# Patient Record
Sex: Female | Born: 1995 | Race: Black or African American | Hispanic: No | Marital: Single | Smoking: Never smoker
Health system: Southern US, Community
[De-identification: ages and names within clinical notes are randomized; demographics above are authoritative.]

---

## 2013-05-15 ENCOUNTER — Ambulatory Visit: Payer: Self-pay | Admitting: Family Medicine

## 2014-10-06 IMAGING — US ABDOMEN ULTRASOUND LIMITED
1 series · 14 of 25 positions shown · non-contrast
Comparison: none

REASON FOR EXAM: epigastric pain regurgitation of food  eval for
gallstones
COMMENTS:

[Series 1: abdomen ultrasound limited · 0.25mm/px · 14 of 79 slices shown]
[im 1/79]
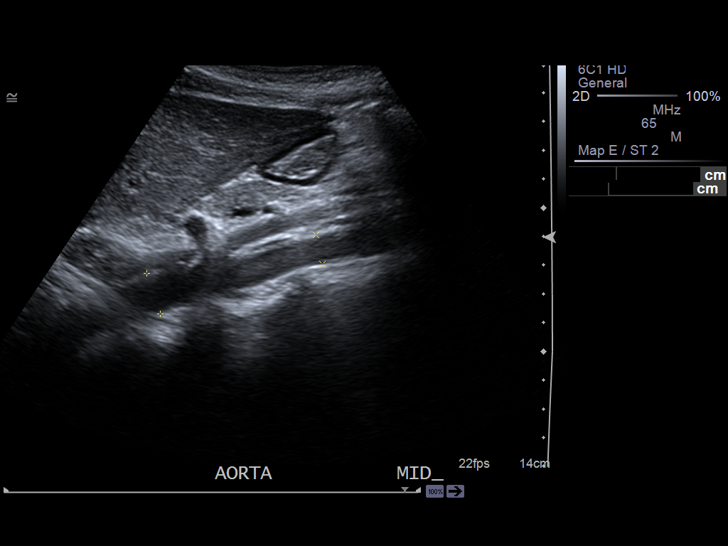
[im 7/79]
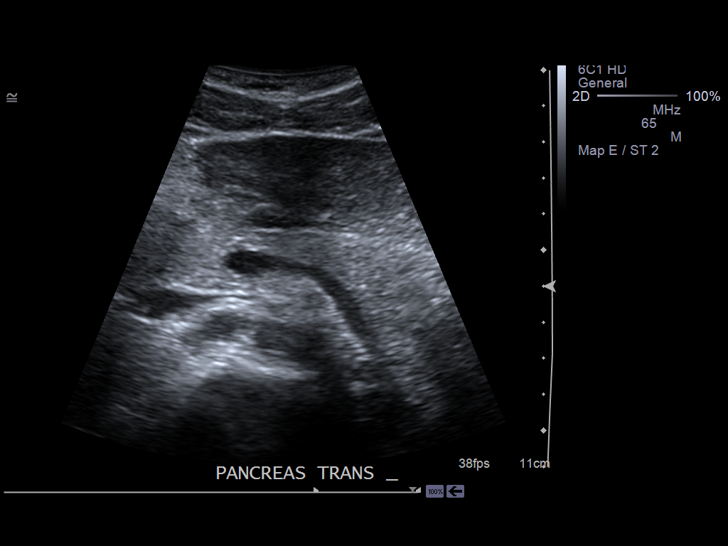
[im 14/79]
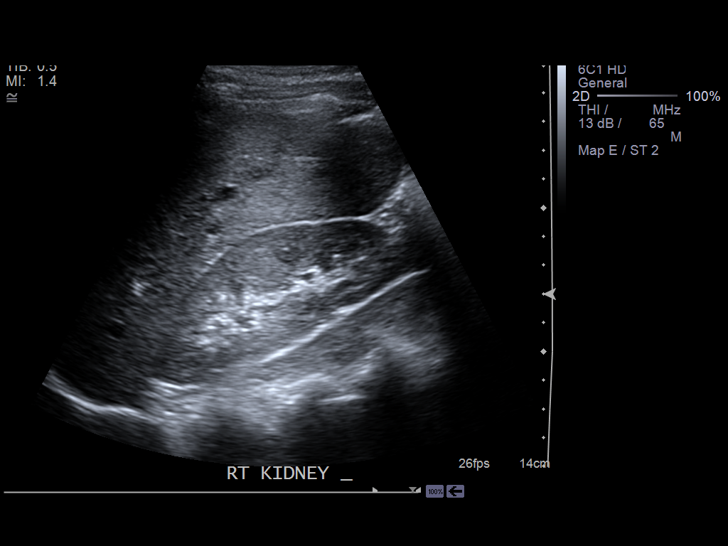
[im 20/79]
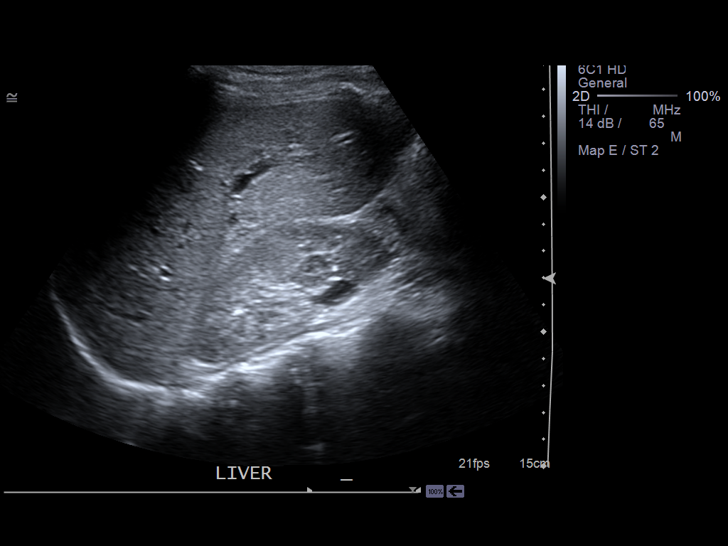
[im 27/79]
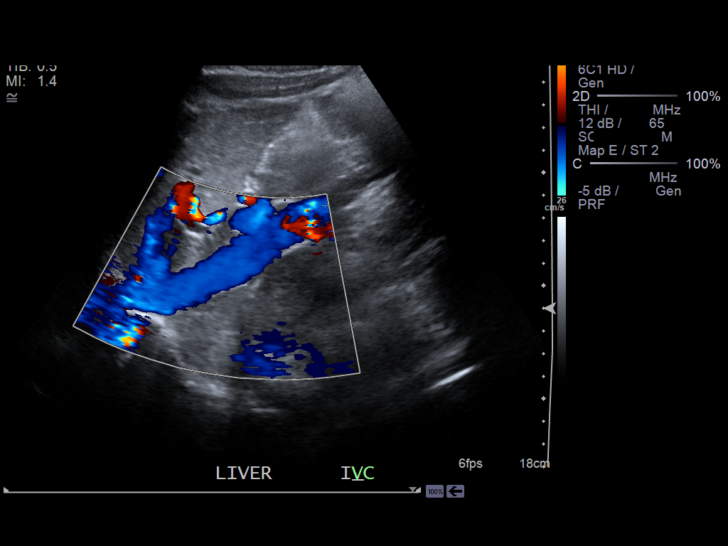
[im 30/79]
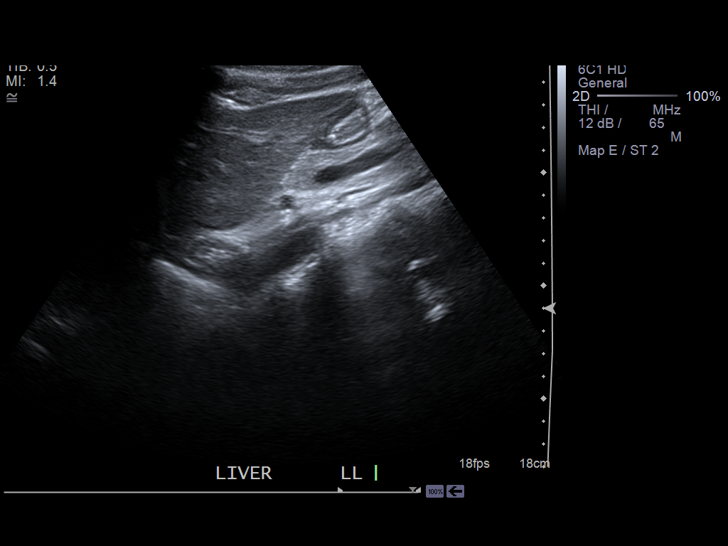
[im 36/79]
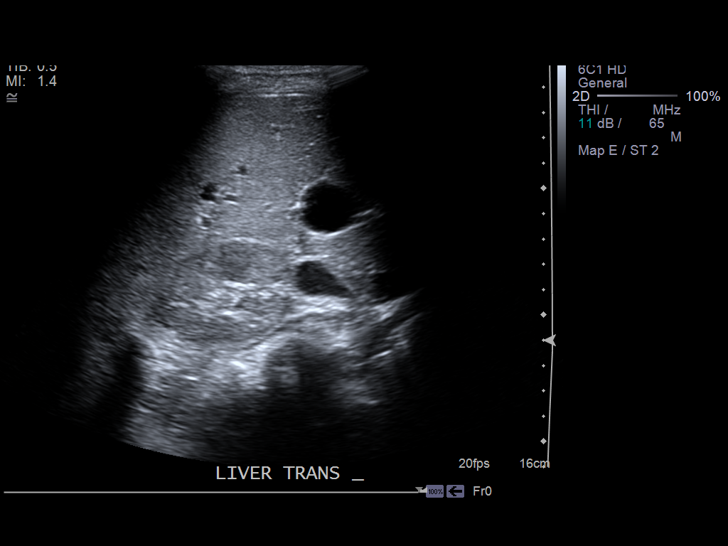
[im 43/79]
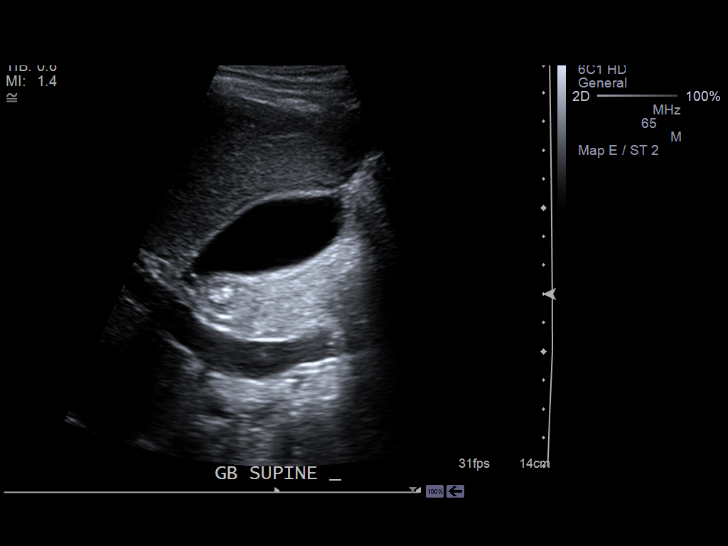
[im 49/79]
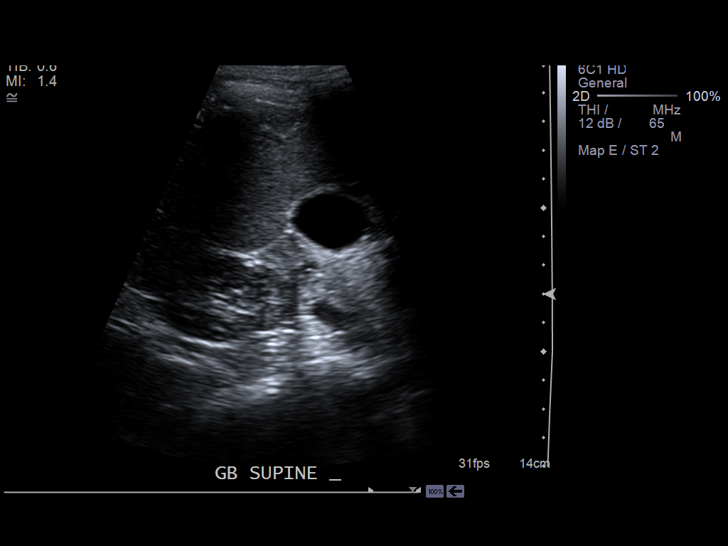
[im 53/79]
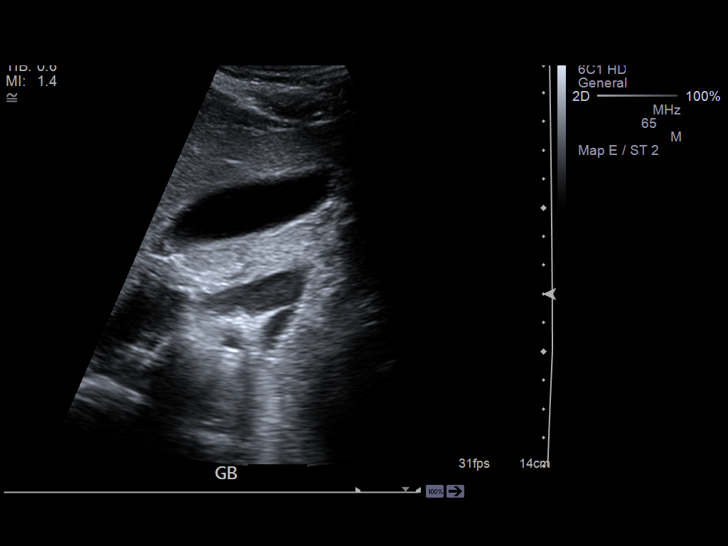
[im 59/79]
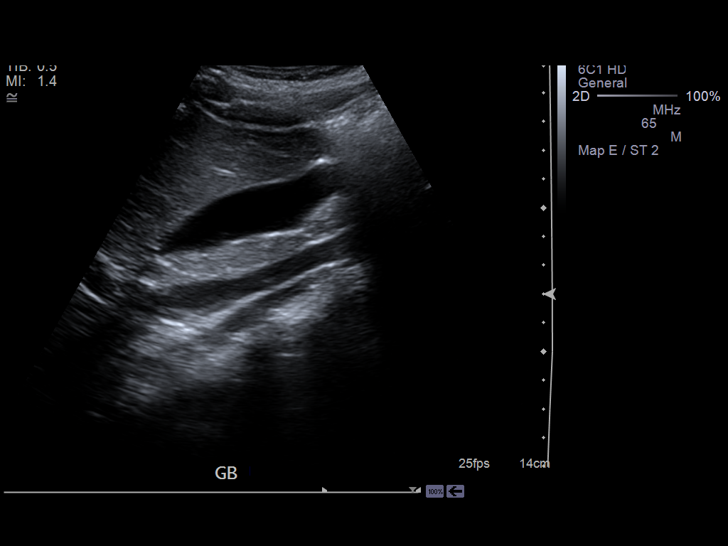
[im 66/79]
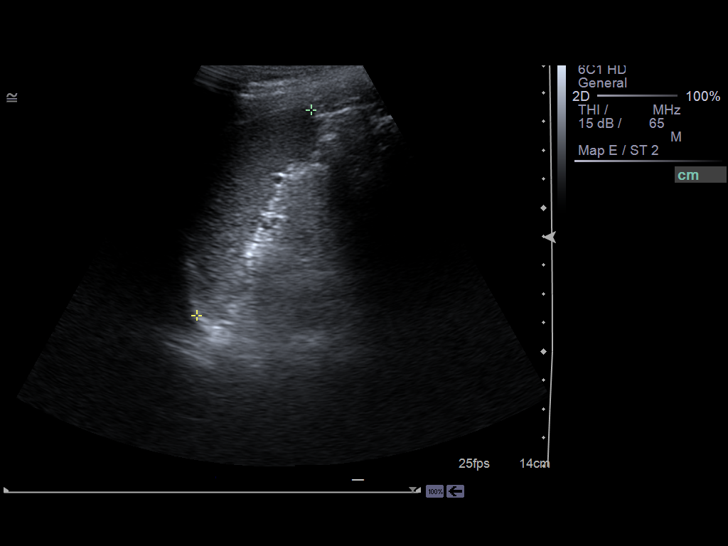
[im 72/79]
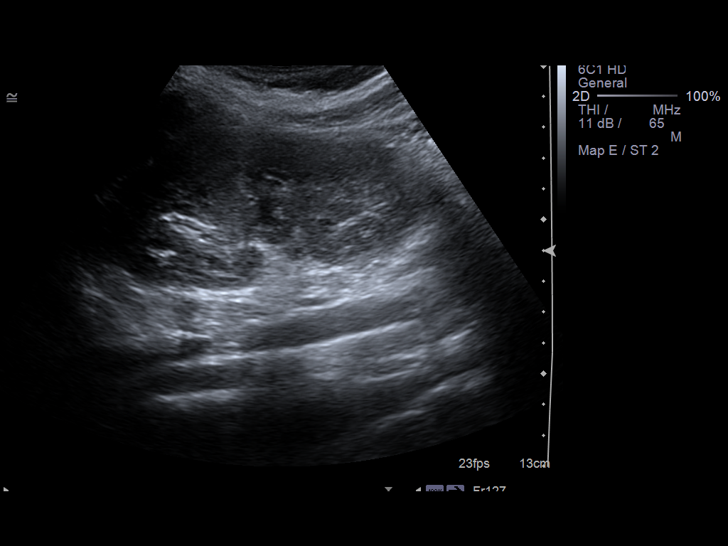
[im 79/79]
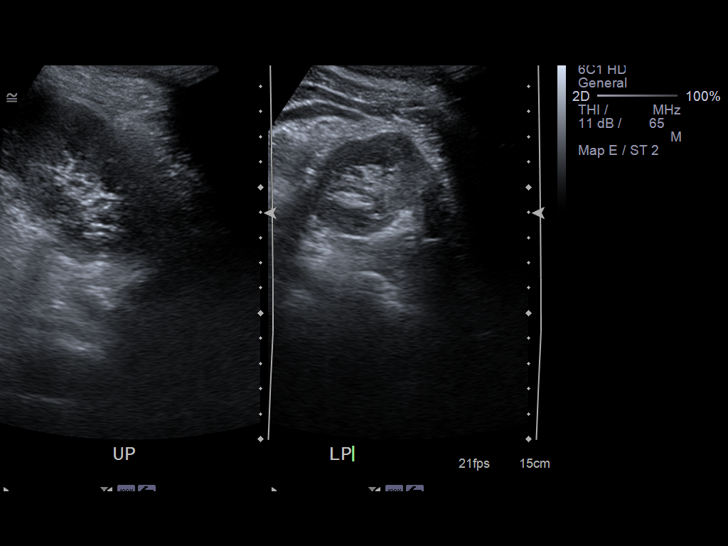

[14 of 25 positions shown; findings below may reference images not displayed]

PROCEDURE:     TMOS - TMOS ABDOMEN UPPER GENERAL  - May 15, 2013  [DATE]

RESULT:     The liver exhibits normal echotexture with no focal mass nor
ductal dilation. Portal venous flow is normal in direction toward the liver.
The gallbladder is adequately distended with no evidence of stones, wall
thickening, or pericholecystic fluid. There is no positive sonographic
Murphy's sign. The common bile duct measures 2 mm in diameter.

The pancreas, spleen, kidneys, abdominal aorta, and inferior vena cava are
normal in appearance. There is no evidence of ascites.
IMPRESSION: Normal abdominal ultrasound examination.

[REDACTED]

## 2016-11-13 ENCOUNTER — Encounter: Payer: Self-pay | Admitting: Emergency Medicine

## 2016-11-13 ENCOUNTER — Emergency Department: Payer: No Typology Code available for payment source

## 2016-11-13 DIAGNOSIS — Y999 Unspecified external cause status: Secondary | ICD-10-CM | POA: Insufficient documentation

## 2016-11-13 DIAGNOSIS — S20219A Contusion of unspecified front wall of thorax, initial encounter: Secondary | ICD-10-CM | POA: Insufficient documentation

## 2016-11-13 DIAGNOSIS — Y92410 Unspecified street and highway as the place of occurrence of the external cause: Secondary | ICD-10-CM | POA: Insufficient documentation

## 2016-11-13 DIAGNOSIS — S299XXA Unspecified injury of thorax, initial encounter: Secondary | ICD-10-CM | POA: Diagnosis present

## 2016-11-13 DIAGNOSIS — Y9389 Activity, other specified: Secondary | ICD-10-CM | POA: Insufficient documentation
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
## 2016-11-14 ENCOUNTER — Emergency Department
Admission: EM | Admit: 2016-11-14 | Discharge: 2016-11-14 | Disposition: A | Discharge status: Home / Self Care | Payer: No Typology Code available for payment source | Attending: Emergency Medicine | Admitting: Emergency Medicine

## 2016-11-14 DIAGNOSIS — S20219A Contusion of unspecified front wall of thorax, initial encounter: Secondary | ICD-10-CM

## 2016-11-14 MED ORDER — IBUPROFEN 400 MG PO TABS
400.0000 mg | ORAL_TABLET | Freq: Once | ORAL | Status: AC
  Administered 2016-11-14: 400 mg via ORAL

## 2016-11-14 MED ORDER — IBUPROFEN 400 MG PO TABS
ORAL_TABLET | ORAL | Status: AC
  Administered 2016-11-14: 400 mg via ORAL
  Filled 2016-11-14: qty 1

## 2016-11-14 NOTE — ED Notes (Signed)
Pt discharged to home.  Family member driving.  Discharge instructions reviewed.  Verbalized understanding.  No questions or concerns at this time.  Teach back verified.  Pt in NAD.  No items left in ED.   
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
## 2017-02-25 LAB — HM HIV SCREENING LAB: HM HIV Screening: NEGATIVE
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
## 2019-11-16 ENCOUNTER — Other Ambulatory Visit: Payer: Self-pay

## 2019-11-16 DIAGNOSIS — Z111 Encounter for screening for respiratory tuberculosis: Secondary | ICD-10-CM

## 2019-11-19 LAB — QUANTIFERON-TB GOLD PLUS
QuantiFERON Mitogen Value: 4.85 IU/mL
QuantiFERON Nil Value: 0.02 IU/mL
QuantiFERON TB1 Ag Value: 0.02 IU/mL
QuantiFERON TB2 Ag Value: 0.02 IU/mL
QuantiFERON-TB Gold Plus: NEGATIVE

## 2019-11-19 NOTE — Progress Notes (Signed)
Patient called. Patient will pick up copy of results. Aileen Fass, RN

## 2019-11-21 ENCOUNTER — Other Ambulatory Visit: Payer: Self-pay

## 2019-11-21 ENCOUNTER — Encounter: Payer: Self-pay | Admitting: Emergency Medicine

## 2019-11-21 ENCOUNTER — Emergency Department
Admission: EM | Admit: 2019-11-21 | Discharge: 2019-11-21 | Disposition: A | Discharge status: Home / Self Care | Payer: Self-pay | Attending: Emergency Medicine | Admitting: Emergency Medicine

## 2019-11-21 DIAGNOSIS — B349 Viral infection, unspecified: Secondary | ICD-10-CM

## 2019-11-21 DIAGNOSIS — U071 COVID-19: Secondary | ICD-10-CM | POA: Insufficient documentation

## 2019-11-21 LAB — URINALYSIS, COMPLETE (UACMP) WITH MICROSCOPIC
Bacteria, UA: NONE SEEN
Bilirubin Urine: NEGATIVE
Glucose, UA: NEGATIVE mg/dL
Hgb urine dipstick: NEGATIVE
Ketones, ur: 5 mg/dL — AB
Nitrite: NEGATIVE
Protein, ur: NEGATIVE mg/dL
Specific Gravity, Urine: 1.011 (ref 1.005–1.030)
pH: 6 (ref 5.0–8.0)

## 2019-11-21 LAB — GROUP A STREP BY PCR: Group A Strep by PCR: NOT DETECTED

## 2019-11-21 LAB — POCT PREGNANCY, URINE: Preg Test, Ur: NEGATIVE

## 2019-11-21 MED ORDER — ONDANSETRON 8 MG PO TBDP
8.0000 mg | ORAL_TABLET | Freq: Once | ORAL | Status: AC
  Administered 2019-11-21: 08:00:00 8 mg via ORAL
  Filled 2019-11-21: qty 1

## 2019-11-21 MED ORDER — IBUPROFEN 600 MG PO TABS: 600.0000 mg | ORAL_TABLET | Freq: Three times a day (TID) | ORAL | 0 refills | Status: DC | PRN

## 2019-11-21 MED ORDER — ONDANSETRON HCL 8 MG PO TABS: 8.0000 mg | ORAL_TABLET | Freq: Three times a day (TID) | ORAL | 0 refills | Status: DC | PRN

## 2019-11-21 MED ORDER — ACETAMINOPHEN 325 MG PO TABS
650.0000 mg | ORAL_TABLET | Freq: Once | ORAL | Status: AC
  Administered 2019-11-21: 650 mg via ORAL
  Filled 2019-11-21: qty 2

## 2019-11-21 NOTE — ED Notes (Signed)
Pt in bathroom in attempt to collect urine specimen.

## 2019-11-21 NOTE — ED Notes (Signed)
See triage note  Presents with headache and some vomiting   Unsure of fever at home but has low grade on arrival

## 2019-11-21 NOTE — ED Provider Notes (Signed)
[facility] Emergency Department Provider Note   ____________________________________________   First MD Initiated Contact with Patient 11/21/19 [phone]     (approximate)  I have reviewed the triage vital signs and the nursing notes.   HISTORY  Chief Complaint Headache and Emesis    HPI Shirley Wood is a 23 y.o. female patient complain of headache and emesis for 1 day.  Patient states she struck her head on the garage door yesterday.  Patient denies LOC.  Patient denies vision disturbance or vertigo.  Patient state headache since incident.  Patient states 8 results of vomiting.  Patient denies diarrhea.  Patient stated body aches.  Patient states recent exposure to COVID-19.  Patient unsure of fever.  Patient denies URI signs and symptoms.  Patient also complain of sore throat.  Patient able to tolerate food and fluids.  Patient denies recent travel.  Patient has taken flu shot for this season.  Patient rates pain 7/10.  Patient described pain is "achy".  No palliative measure for complaint.      History reviewed. No pertinent past medical history.  There are no problems to display for this patient.   History reviewed. No pertinent surgical history.  Prior to Admission medications   Medication Sig Start Date End Date Taking? Authorizing Provider  ibuprofen (ADVIL) 600 MG tablet Take 1 tablet (600 mg total) by mouth every 8 (eight) hours as needed. 11/21/19   Sable Feil, PA-C  ondansetron (ZOFRAN) 8 MG tablet Take 1 tablet (8 mg total) by mouth every 8 (eight) hours as needed for nausea or vomiting. 11/21/19   Sable Feil, PA-C    Allergies Patient has no known allergies.  History reviewed. No pertinent family history.  Social History Social History   Tobacco Use  . Smoking status: Never Smoker  . Smokeless tobacco: Never Used  Substance Use Topics  . Alcohol use: No  . Drug use: Never    Review of Systems Constitutional:  Fever and body aches. Eyes: No visual changes. ENT: Sore throat.  T. Cardiovascular: Denies chest pain. Respiratory: Denies shortness of breath. Gastrointestinal: No abdominal pain.  Nausea and vomiting.  No diarrhea.  No constipation. Genitourinary: Negative for dysuria. Musculoskeletal: Negative for back pain. Skin: Negative for rash. Neurological: Positive for headaches, denies focal weakness or numbness.   ____________________________________________   PHYSICAL EXAM:  VITAL SIGNS: ED Triage Vitals  Enc Vitals Group     BP 11/21/19 0725 133/86     Pulse Rate 11/21/19 0725 (!) 112     Resp 11/21/19 0725 18     Temp 11/21/19 0725 100 F (37.8 C)     Temp Source 11/21/19 0725 Oral     SpO2 11/21/19 0725 97 %     Weight 11/21/19 0726 159 lb (72.1 kg)     Height 11/21/19 0726 5\' 5"  (1.651 m)     Head Circumference --      Peak Flow --      Pain Score 11/21/19 0726 7     Pain Loc --      Pain Edu? --      Excl. in [place]? --    Constitutional: Alert and oriented. Well appearing and in no acute distress. Mouth/Throat: Mucous membranes are moist.  Oropharynx erythematous. Neck: No stridor.   Hematological/Lymphatic/Immunilogical: No cervical lymphadenopathy. Cardiovascular: Tachycardic, regular rhythm. Grossly normal heart sounds.  Good peripheral circulation. Respiratory: Normal respiratory effort.  No retractions. Lungs CTAB. Gastrointestinal: Soft and nontender. No  distention. No abdominal bruits. No CVA tenderness. Musculoskeletal: No lower extremity tenderness nor edema.  No joint effusions. Neurologic:  Normal speech and language. No gross focal neurologic deficits are appreciated. No gait instability. Skin:  Skin is warm, dry and intact. No rash noted. Psychiatric: Mood and affect are normal. Speech and behavior are normal.  ____________________________________________   LABS (all labs ordered are listed, but only abnormal results are displayed)  Labs Reviewed    URINALYSIS, COMPLETE (UACMP) WITH MICROSCOPIC - Abnormal; Notable for the following components:      Result Value   Color, Urine YELLOW (*)    APPearance CLEAR (*)    Ketones, ur 5 (*)    Leukocytes,Ua TRACE (*)    All other components within normal limits  GROUP A STREP BY PCR  SARS CORONAVIRUS 2 (TAT 6-24 HRS)  POC URINE PREG, ED  POCT PREGNANCY, URINE   ____________________________________________  EKG   ____________________________________________  RADIOLOGY  ED MD interpretation:    Official radiology report(s): No results found.  ____________________________________________   PROCEDURES  Procedure(s) performed (including Critical Care):  Procedures   ____________________________________________   INITIAL IMPRESSION / ASSESSMENT AND PLAN / ED COURSE  As part of my medical decision making, I reviewed the following data within the electronic MEDICAL RECORD NUMBER     Patient presents with headache for 2 days.  Patient had nausea and vomiting started this morning with 8 episodes prior to arrival.  Patient physical exam is consistent with viral illness and mild dehydration.  Patient is bonded well to Zofran and Tylenol.  Patient states she did not want to receive IV hydrations since she can tolerate fluids.  Advised follow discharge care instruction take medication as directed.  Patient was self quarantine pending results of COVID-19 test.  Return to ED if condition worsens.    Shirley Wood was evaluated in Emergency Department on 11/21/2019 for the symptoms described in the history of present illness. She was evaluated in the context of the global COVID-19 pandemic, which necessitated consideration that the patient might be at risk for infection with the SARS-CoV-2 virus that causes COVID-19. Institutional protocols and algorithms that pertain to the evaluation of patients at risk for COVID-19 are in a state of rapid change based on information released by  regulatory bodies including the CDC and federal and state organizations. These policies and algorithms were followed during the patient's care in the ED.       ____________________________________________   FINAL CLINICAL IMPRESSION(S) / ED DIAGNOSES  Final diagnoses:  Viral illness     ED Discharge Orders         Ordered    ondansetron (ZOFRAN) 8 MG tablet  Every 8 hours PRN     11/21/19 0828    ibuprofen (ADVIL) 600 MG tablet  Every 8 hours PRN     11/21/19 3790           Note:  This document was prepared using Dragon voice recognition software and may include unintentional dictation errors.    Joni Reining, PA-C 11/21/19 0831    Chesley Noon, MD 11/21/19 [phone]

## 2019-11-21 NOTE — Discharge Instructions (Signed)
Follow discharge care instruction take medication as directed.  Advised self quarantine pending results of COVID-19 test. 

## 2019-11-21 NOTE — ED Triage Notes (Signed)
Pt via pov from home with headache x 2 days and emesis today x 8. Pt states she also has body aches today. Pt alert & oriented with NAD noted.

## 2019-11-22 LAB — SARS CORONAVIRUS 2 (TAT 6-24 HRS): SARS Coronavirus 2: POSITIVE — AB

## 2019-11-23 ENCOUNTER — Telehealth: Payer: Self-pay | Admitting: Emergency Medicine

## 2019-11-23 NOTE — Telephone Encounter (Signed)
Called pateint to assure she is aware of positive covid result.  She is aware.  Discussed quarqantine of contacts and self isolation.

## 2020-06-14 ENCOUNTER — Encounter: Payer: Self-pay | Admitting: Emergency Medicine

## 2020-06-14 DIAGNOSIS — Y929 Unspecified place or not applicable: Secondary | ICD-10-CM | POA: Insufficient documentation

## 2020-06-14 DIAGNOSIS — Y939 Activity, unspecified: Secondary | ICD-10-CM | POA: Insufficient documentation

## 2020-06-14 DIAGNOSIS — S80861A Insect bite (nonvenomous), right lower leg, initial encounter: Secondary | ICD-10-CM | POA: Insufficient documentation

## 2020-06-14 DIAGNOSIS — Y999 Unspecified external cause status: Secondary | ICD-10-CM | POA: Insufficient documentation

## 2020-06-14 DIAGNOSIS — W57XXXA Bitten or stung by nonvenomous insect and other nonvenomous arthropods, initial encounter: Secondary | ICD-10-CM | POA: Insufficient documentation

## 2020-06-14 LAB — COMPREHENSIVE METABOLIC PANEL
ALT: 12 U/L (ref 0–44)
AST: 17 U/L (ref 15–41)
Albumin: 3.9 g/dL (ref 3.5–5.0)
Alkaline Phosphatase: 52 U/L (ref 38–126)
Anion gap: 7 (ref 5–15)
BUN: 14 mg/dL (ref 6–20)
CO2: 25 mmol/L (ref 22–32)
Calcium: 8.9 mg/dL (ref 8.9–10.3)
Chloride: 105 mmol/L (ref 98–111)
Creatinine, Ser: 0.92 mg/dL (ref 0.44–1.00)
GFR calc Af Amer: >60 mL/min (ref >60)
GFR calc non Af Amer: >60 mL/min (ref >60)
Glucose, Bld: 105 mg/dL — ABNORMAL HIGH (ref 70–99)
Potassium: 3.3 mmol/L — ABNORMAL LOW (ref 3.5–5.1)
Sodium: 137 mmol/L (ref 135–145)
Total Bilirubin: 1 mg/dL (ref 0.3–1.2)
Total Protein: 7.5 g/dL (ref 6.5–8.1)

## 2020-06-14 LAB — CBC WITH DIFFERENTIAL/PLATELET
Abs Immature Granulocytes: 0.01 10*3/uL (ref 0.00–0.07)
Basophils Absolute: 0 10*3/uL (ref 0.0–0.1)
Basophils Relative: 1 %
Eosinophils Absolute: 0.6 10*3/uL — ABNORMAL HIGH (ref 0.0–0.5)
Eosinophils Relative: 8 %
HCT: 36.6 % (ref 36.0–46.0)
Hemoglobin: 12.5 g/dL (ref 12.0–15.0)
Immature Granulocytes: 0 %
Lymphocytes Relative: 50 %
Lymphs Abs: 3.9 10*3/uL (ref 0.7–4.0)
MCH: 31.3 pg (ref 26.0–34.0)
MCHC: 34.2 g/dL (ref 30.0–36.0)
MCV: 91.5 fL (ref 80.0–100.0)
Monocytes Absolute: 0.4 10*3/uL (ref 0.1–1.0)
Monocytes Relative: 5 %
Neutro Abs: 2.8 10*3/uL (ref 1.7–7.7)
Neutrophils Relative %: 36 %
Platelets: 348 10*3/uL (ref 150–400)
RBC: 4 MIL/uL (ref 3.87–5.11)
RDW: 13.2 % (ref 11.5–15.5)
WBC: 7.6 10*3/uL (ref 4.0–10.5)
nRBC: 0 % (ref 0.0–0.2)

## 2020-06-14 NOTE — ED Triage Notes (Signed)
Pt reports insect bite x1 week ago to the right upper thigh and lower leg. Swelling and blister formation over the last 2 days. Pt denies fever. Redness noted with blisters and dark center.

## 2020-06-15 ENCOUNTER — Emergency Department
Admission: EM | Admit: 2020-06-15 | Discharge: 2020-06-15 | Disposition: A | Discharge status: Home / Self Care | Payer: Self-pay | Attending: Emergency Medicine | Admitting: Emergency Medicine

## 2020-06-15 ENCOUNTER — Other Ambulatory Visit: Payer: Self-pay

## 2020-06-15 DIAGNOSIS — W57XXXA Bitten or stung by nonvenomous insect and other nonvenomous arthropods, initial encounter: Secondary | ICD-10-CM

## 2020-06-15 MED ORDER — SULFAMETHOXAZOLE-TRIMETHOPRIM 800-160 MG PO TABS: 1.0000 | ORAL_TABLET | Freq: Two times a day (BID) | ORAL | 0 refills | Status: AC

## 2020-06-15 MED ORDER — HYDROXYZINE HCL 50 MG PO TABS: 50.0000 mg | ORAL_TABLET | Freq: Three times a day (TID) | ORAL | 0 refills | Status: AC | PRN

## 2020-06-15 MED ORDER — METHYLPREDNISOLONE 4 MG PO TBPK: ORAL_TABLET | ORAL | 0 refills | Status: AC

## 2020-06-15 NOTE — ED Provider Notes (Signed)
[facility] Emergency Department Provider Note   ____________________________________________   None    (approximate)  I have reviewed the triage vital signs and the nursing notes.   HISTORY  Chief Complaint Insect Bite    HPI Shirley Wood is a 24 y.o. female patient presents with vesicle lesions on erythematous base of the right lower leg.  Patient states she went fishing last week and was bitten by multiple insects.  Patient was seen by urgent care clinic but advised to come to ER to have blood test.  Patient denies fever, headache, nausea, or vertigo.  Patient complains associated with itching.  No palliative measure for complaint.     History reviewed. No pertinent past medical history.  There are no problems to display for this patient.   History reviewed. No pertinent surgical history.  Prior to Admission medications   Medication Sig Start Date End Date Taking? Authorizing Provider  hydrOXYzine (ATARAX/VISTARIL) 50 MG tablet Take 1 tablet (50 mg total) by mouth 3 (three) times daily as needed for itching. 06/15/20   Joni Reining, PA-C  methylPREDNISolone (MEDROL DOSEPAK) 4 MG TBPK tablet Take Tapered dose as directed 06/15/20   Joni Reining, PA-C  sulfamethoxazole-trimethoprim (BACTRIM DS) 800-160 MG tablet Take 1 tablet by mouth 2 (two) times daily. 06/15/20   Joni Reining, PA-C    Allergies Patient has no known allergies.  History reviewed. No pertinent family history.  Social History Social History   Tobacco Use  . Smoking status: Never Smoker  . Smokeless tobacco: Never Used  Vaping Use  . Vaping Use: Never used  Substance Use Topics  . Alcohol use: No  . Drug use: Never    Review of Systems Constitutional: No fever/chills Eyes: No visual changes. ENT: No sore throat. Cardiovascular: Denies chest pain. Respiratory: Denies shortness of breath. Gastrointestinal: No abdominal pain.  No nausea, no vomiting.  No  diarrhea.  No constipation. Genitourinary: Negative for dysuria. Musculoskeletal: Negative for back pain. Skin: Positive for rash. Neurological: Negative for headaches, focal weakness or numbness.   ____________________________________________   PHYSICAL EXAM:  VITAL SIGNS: ED Triage Vitals  Enc Vitals Group     BP 06/14/20 2220 131/86     Pulse Rate 06/14/20 2220 86     Resp 06/14/20 2220 17     Temp 06/14/20 2220 98.8 F (37.1 C)     Temp Source 06/14/20 2220 Oral     SpO2 06/14/20 2220 99 %     Weight 06/15/20 0722 158 lb 15.2 oz (72.1 kg)     Height 06/15/20 0722 5\' 5"  (1.651 m)     Head Circumference --      Peak Flow --      Pain Score --      Pain Loc --      Pain Edu? --      Excl. in [place]? --    Constitutional: Alert and oriented. Well appearing and in no acute distress. Cardiovascular: Normal rate, regular rhythm. Grossly normal heart sounds.  Good peripheral circulation. Respiratory: Normal respiratory effort.  No retractions. Lungs CTAB. Skin:  Skin is warm, dry and intact.  Vesicular lesions. Psychiatric: Mood and affect are normal. Speech and behavior are normal.  ____________________________________________   LABS (all labs ordered are listed, but only abnormal results are displayed)  Labs Reviewed  COMPREHENSIVE METABOLIC PANEL - Abnormal; Notable for the following components:      Result Value   Potassium 3.3 (*)  Glucose, Bld 105 (*)    All other components within normal limits  CBC WITH DIFFERENTIAL/PLATELET - Abnormal; Notable for the following components:   Eosinophils Absolute 0.6 (*)    All other components within normal limits  LACTIC ACID, PLASMA  LACTIC ACID, PLASMA   ____________________________________________  EKG   ____________________________________________  RADIOLOGY  ED MD interpretation:    Official radiology report(s): No results found.  ____________________________________________   PROCEDURES  Procedure(s)  performed (including Critical Care):  Procedures   ____________________________________________   INITIAL IMPRESSION / ASSESSMENT AND PLAN / ED COURSE  As part of my medical decision making, I reviewed the following data within the electronic MEDICAL RECORD NUMBER     Patient presents with edema/erythema right lower leg with physical lesions.  Patient has multiple papular vesicular lesions.  Differential consist of infected insect bites versus cellulitis secondary to contact dermatitis.  Patient given discharge care instruction and a prescription for Bactrim DS, Atarax, Medrol Dosepak.  Patient advised follow-up PCP.    Shirley Wood was evaluated in Emergency Department on 06/15/2020 for the symptoms described in the history of present illness. She was evaluated in the context of the global COVID-19 pandemic, which necessitated consideration that the patient might be at risk for infection with the SARS-CoV-2 virus that causes COVID-19. Institutional protocols and algorithms that pertain to the evaluation of patients at risk for COVID-19 are in a state of rapid change based on information released by regulatory bodies including the CDC and federal and state organizations. These policies and algorithms were followed during the patient's care in the ED.       ____________________________________________   FINAL CLINICAL IMPRESSION(S) / ED DIAGNOSES  Final diagnoses:  Bug bite with infection, initial encounter     ED Discharge Orders         Ordered    sulfamethoxazole-trimethoprim (BACTRIM DS) 800-160 MG tablet  2 times daily     Discontinue  Reprint     06/15/20 0724    hydrOXYzine (ATARAX/VISTARIL) 50 MG tablet  3 times daily PRN     Discontinue  Reprint     06/15/20 0724    methylPREDNISolone (MEDROL DOSEPAK) 4 MG TBPK tablet     Discontinue  Reprint     06/15/20 0732           Note:  This document was prepared using Dragon voice recognition software and may include  unintentional dictation errors.    Joni Reining, PA-C 06/15/20 0732    Concha Se, MD 06/15/20 [phone]

## 2020-06-15 NOTE — Discharge Instructions (Signed)
Follow discharge care instruction take medication as directed. °

## 2020-06-15 NOTE — ED Notes (Signed)
See triage note  Presents with possible spider/insect bite to right leg  Large blistered area noted to lower leg  Also another area noted on upper thigh

## 2021-02-15 ENCOUNTER — Encounter: Payer: Self-pay | Admitting: Emergency Medicine

## 2021-02-15 ENCOUNTER — Other Ambulatory Visit: Payer: Self-pay

## 2021-02-15 DIAGNOSIS — L03114 Cellulitis of left upper limb: Secondary | ICD-10-CM | POA: Insufficient documentation

## 2021-02-15 NOTE — ED Triage Notes (Signed)
Pt to ED from home c/o abscess and swelling to left elbow.  States noticed spot she thought was a mole for about 5 weeks but then last week tried popping it last week.  Was going to have it treated by PCP tomorrow but then d/t sudden swelling around area tonight was advised to come to ED.  Pt had fever 101 at home and took IBU around 2100 tonight.  Area swollen and red.

## 2021-02-15 NOTE — ED Notes (Signed)
No protocols at this time per Dr. Vicente Males.

## 2021-02-16 ENCOUNTER — Emergency Department
Admission: EM | Admit: 2021-02-16 | Discharge: 2021-02-16 | Disposition: A | Discharge status: Home / Self Care | Payer: Self-pay | Attending: Emergency Medicine | Admitting: Emergency Medicine

## 2021-02-16 DIAGNOSIS — L03114 Cellulitis of left upper limb: Secondary | ICD-10-CM

## 2021-02-16 MED ORDER — CEPHALEXIN 500 MG PO CAPS: 500.0000 mg | ORAL_CAPSULE | Freq: Four times a day (QID) | ORAL | 0 refills | Status: AC

## 2021-02-16 NOTE — ED Notes (Signed)
Pt awake and alert; oriented x4.  Confirms mole like appearance to L elbow noticed a few ago - reports edema, redness, tenderness developed day of arrival -- edema has improved but not completely resolved; site remains erythematous and warm to touch.  LUE PMS otherwise intact.  Pt denies insect bite or injury.

## 2021-02-16 NOTE — ED Provider Notes (Signed)
[facility] Emergency Department Provider Note   ____________________________________________   I have reviewed the triage vital signs and the nursing notes.   HISTORY  Chief Complaint Left elbow pain/swelling  History limited by: Not Limited   HPI Shirley Wood is a 25 y.o. female who presents to the emergency department today because of concerns for swelling and pain to her left elbow.  Patient states that her symptoms started yesterday.  She says that while she was at work the swelling started.  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  After she went home the swelling became acutely worse.  It was painful.  However by the time of my exam the swelling has gone down quite a bit.  Patient is not noticed any drainage.  Denies similar symptoms in the past but states that she has noticed she has been more sensitive to things recently.  Records reviewed. Per medical record review patient has a history of ER visit for bug bite with infection over the summer.  History reviewed. No pertinent past medical history.  There are no problems to display for this patient.   History reviewed. No pertinent surgical history.  Prior to Admission medications   Medication Sig Start Date End Date Taking? Authorizing Provider  cephALEXin (KEFLEX) 500 MG capsule Take 1 capsule (500 mg total) by mouth 4 (four) times daily for 7 days. 02/16/21 02/23/21 Yes Phineas Semen, MD  hydrOXYzine (ATARAX/VISTARIL) 50 MG tablet Take 1 tablet (50 mg total) by mouth 3 (three) times daily as needed for itching. 06/15/20   Joni Reining, PA-C  methylPREDNISolone (MEDROL DOSEPAK) 4 MG TBPK tablet Take Tapered dose as directed 06/15/20   Joni Reining, PA-C  sulfamethoxazole-trimethoprim (BACTRIM DS) 800-160 MG tablet Take 1 tablet by mouth 2 (two) times daily. 06/15/20   Joni Reining, PA-C    Allergies Shellfish allergy  History  reviewed. No pertinent family history.  Social History Social History   Tobacco Use  . Smoking status: Never Smoker  . Smokeless tobacco: Never Used  Vaping Use  . Vaping Use: Never used  Substance Use Topics  . Alcohol use: No  . Drug use: Never    Review of Systems Constitutional: No fever/chills Eyes: No visual changes. ENT: No sore throat. Cardiovascular: Denies chest pain. Respiratory: Denies shortness of breath. Gastrointestinal: No abdominal pain.  No nausea, no vomiting.  No diarrhea.   Genitourinary: Negative for dysuria. Musculoskeletal: Left elbow swelling and pain. Skin: Negative for rash. Neurological: Negative for headaches, focal weakness or numbness.  ____________________________________________   PHYSICAL EXAM:  VITAL SIGNS: ED Triage Vitals  Enc Vitals Group     BP 02/15/21 2310 (!) 145/93     Pulse Rate 02/15/21 2310 (!) 114     Resp 02/15/21 2310 16     Temp 02/15/21 2310 99 F (37.2 C)     Temp Source 02/15/21 2310 Oral     SpO2 02/15/21 2310 98 %     Weight 02/15/21 2315 154 lb (69.9 kg)     Height 02/15/21 2315 5\' 6"  (1.676 m)     Head Circumference --      Peak Flow --      Pain Score 02/15/21 2314 5   Constitutional: Alert and oriented.  Eyes: Conjunctivae are normal.  ENT      Head: Normocephalic and atraumatic.      Nose: No congestion/rhinnorhea.  Mouth/Throat: Mucous membranes are moist.      Neck: No stridor. Hematological/Lymphatic/Immunilogical: No cervical lymphadenopathy. Cardiovascular: Normal rate, regular rhythm.  No murmurs, rubs, or gallops.  Respiratory: Normal respiratory effort without tachypnea nor retractions. Breath sounds are clear and equal bilaterally. No wheezes/rales/rhonchi. Gastrointestinal: Soft and non tender. No rebound. No guarding.  Genitourinary: Deferred Musculoskeletal: Left elbow with small amount of swelling and erythema. Full and normal range of motion. Neurologic:  Normal speech and  language. No gross focal neurologic deficits are appreciated.  Skin:  Skin is warm, dry and intact. No rash noted. Psychiatric: Mood and affect are normal. Speech and behavior are normal. Patient exhibits appropriate insight and judgment.  ____________________________________________    LABS (pertinent positives/negatives)  None  ____________________________________________   EKG  None  ____________________________________________    RADIOLOGY  None  ____________________________________________   PROCEDURES  Procedures  ____________________________________________   INITIAL IMPRESSION / ASSESSMENT AND PLAN / ED COURSE  Pertinent labs & imaging results that were available during my care of the patient were reviewed by me and considered in my medical decision making (see chart for details).   Patient presented to the emergency department today because of concerns for pain and swelling to her left elbow.  On exam patient had a small amount of swelling although it is quite decreased from the photo she showed of what it looked like earlier in the day.  I did perform a bedside ultrasound to evaluate for any subcutaneous collection of fluid.  Did not identify any on the ultrasound.  At this point do think abscess is less likely.  I discussed this with the patient.  I do think she likely suffering from cellulitis.  Will plan on starting antibiotics and discussed importance of following up with her physician.  ____________________________________________   FINAL CLINICAL IMPRESSION(S) / ED DIAGNOSES  Final diagnoses:  Cellulitis of left upper extremity     Note: This dictation was prepared with Dragon dictation. Any transcriptional errors that result from this process are unintentional     Phineas Semen, MD 02/16/21 [phone]

## 2021-02-16 NOTE — ED Notes (Signed)
Dr. Goodman at bedside.  

## 2021-02-16 NOTE — ED Notes (Signed)
Pt ambulatory from triage/waiting room - steady gait; no acute distress.  Awaits provider eval.

## 2021-02-16 NOTE — Discharge Instructions (Addendum)
Please seek medical attention for any high fevers, chest pain, shortness of breath, change in behavior, persistent vomiting, bloody stool or any other new or concerning symptoms.  

## 2021-02-16 NOTE — ED Notes (Signed)
Pt agreeable with d/c plan as discussed by provider Dr Derrill Kay - this nurse has verbally reinforced d/c instructions and provided pt with written copy -pt acknowledges verbal understanding and denies any additional questions, concerns, needs.  Ambulatory at discharge independently with steady gait;  No acute distress

## 2022-08-04 ENCOUNTER — Other Ambulatory Visit: Payer: Self-pay

## 2022-08-04 ENCOUNTER — Emergency Department
Admission: EM | Admit: 2022-08-04 | Discharge: 2022-08-05 | Disposition: A | Discharge status: Home / Self Care | Payer: Self-pay | Attending: Emergency Medicine | Admitting: Emergency Medicine

## 2022-08-04 ENCOUNTER — Emergency Department: Payer: Self-pay

## 2022-08-04 DIAGNOSIS — R202 Paresthesia of skin: Secondary | ICD-10-CM | POA: Insufficient documentation

## 2022-08-04 DIAGNOSIS — R519 Headache, unspecified: Secondary | ICD-10-CM | POA: Insufficient documentation

## 2022-08-04 DIAGNOSIS — J3489 Other specified disorders of nose and nasal sinuses: Secondary | ICD-10-CM

## 2022-08-04 LAB — BASIC METABOLIC PANEL
Anion gap: 8 (ref 5–15)
BUN: 13 mg/dL (ref 6–20)
CO2: 24 mmol/L (ref 22–32)
Calcium: 9.1 mg/dL (ref 8.9–10.3)
Chloride: 106 mmol/L (ref 98–111)
Creatinine, Ser: 0.79 mg/dL (ref 0.44–1.00)
GFR, Estimated: >60 mL/min (ref >60)
Glucose, Bld: 93 mg/dL (ref 70–99)
Potassium: 3.7 mmol/L (ref 3.5–5.1)
Sodium: 138 mmol/L (ref 135–145)

## 2022-08-04 LAB — CBC
HCT: 40.2 % (ref 36.0–46.0)
Hemoglobin: 13.2 g/dL (ref 12.0–15.0)
MCH: 31 pg (ref 26.0–34.0)
MCHC: 32.8 g/dL (ref 30.0–36.0)
MCV: 94.4 fL (ref 80.0–100.0)
Platelets: 373 10*3/uL (ref 150–400)
RBC: 4.26 MIL/uL (ref 3.87–5.11)
RDW: 12.5 % (ref 11.5–15.5)
WBC: 8.3 10*3/uL (ref 4.0–10.5)
nRBC: 0 % (ref 0.0–0.2)

## 2022-08-04 LAB — TROPONIN I (HIGH SENSITIVITY): Troponin I (High Sensitivity): 3 ng/L (ref <18)

## 2022-08-04 LAB — CBG MONITORING, ED: Glucose-Capillary: 84 mg/dL (ref 70–99)

## 2022-08-04 LAB — POC URINE PREG, ED: Preg Test, Ur: NEGATIVE

## 2022-08-04 NOTE — ED Triage Notes (Signed)
Pt states she ate dinner at 2015 and after had left facial numbness. Pt denies aphasia. Pt states left cheek still has decreased sensation. No facial droop noted, pt can move tongue side to side. Upper and lower extremities strong and equal bilaterally. Pt is AOX4, NAD noted.

## 2022-08-04 NOTE — ED Provider Notes (Signed)
[facility] Provider Note    Event Date/Time   First MD Initiated Contact with Patient 08/04/22 2354     (approximate)   History   Numbness   HPI  Shirley Wood is a 26 y.o. female who presents to the ED for evaluation of Numbness   Patient presents to the ED for evaluation of paresthesias to the left side of her face as well as sinus pressure.  She reports intermittent pressure bifrontal headaches for the past week, then developing paresthesias to the left side of her face this evening over the past 4 hours prior to my evaluation.  Reports they have improved when I first see here such that there is now just a small residual area of paresthesias to her left maxillary cheek.  Denies any vision changes, falls or syncope, headache, neck pain or stiffness, intraoral pain, sore throat, painful mastication.   Reports that she has a history of strokes with older members of her family and she went to make sure that she was okay.   Physical Exam   Triage Vital Signs: ED Triage Vitals [08/04/22 2039]  Enc Vitals Group     BP (!) 145/101     Pulse Rate (!) 131     Resp 16     Temp 98.4 F (36.9 C)     Temp src      SpO2 100 %     Weight 166 lb (75.3 kg)     Height 5\' 5"  (1.651 m)     Head Circumference      Peak Flow      Pain Score 0     Pain Loc      Pain Edu?      Excl. in [place]?     Most recent vital signs: Vitals:   08/04/22 2039 08/05/22 0104  BP: (!) 145/101 126/80  Pulse: (!) 131 86  Resp: 16 17  Temp: 98.4 F (36.9 C)   SpO2: 100% 98%    General: Awake, no distress.  CV:  Good peripheral perfusion.  Resp:  Normal effort.  Abd:  No distention.  MSK:  No deformity noted.  Neuro:  No focal deficits appreciated. Cranial nerves II through XII intact 5/5 strength and sensation in all 4 extremities Other:  Uvula midline, no intraoral lesions or swelling noted.   ED Results / Procedures / Treatments   Labs (all labs ordered  are listed, but only abnormal results are displayed) Labs Reviewed  BASIC METABOLIC PANEL  CBC  MAGNESIUM  POC URINE PREG, ED  CBG MONITORING, ED  TROPONIN I (HIGH SENSITIVITY)    EKG Sinus tachycardia with a rate of 119 bpm.  Normal axis.  Prolonged QTc at 610 ms.  Nonspecific ST changes inferiorly and laterally without STEMI.  RADIOLOGY CT head interpreted by me without evidence of acute intracranial pathology  Official radiology report(s): CT Head Wo Contrast  Result Date: 08/04/2022 CLINICAL DATA:  Left-sided facial numbness EXAM: CT HEAD WITHOUT CONTRAST TECHNIQUE: Contiguous axial images were obtained from the base of the skull through the vertex without intravenous contrast. RADIATION DOSE REDUCTION: This exam was performed according to the departmental dose-optimization program which includes automated exposure control, adjustment of the mA and/or kV according to patient size and/or use of iterative reconstruction technique. COMPARISON:  None Available. FINDINGS: Brain: No evidence of acute infarction, hemorrhage, hydrocephalus, extra-axial collection or mass lesion/mass effect. Vascular: No hyperdense vessel or unexpected calcification. Skull: Normal. Negative for fracture or  focal lesion. Sinuses/Orbits: No acute finding. Other: None. IMPRESSION: No acute intracranial abnormality noted. Electronically Signed   By: Alcide Clever M.D.   On: 08/04/2022 21:23    PROCEDURES and INTERVENTIONS:  Procedures  Medications  acetaminophen (TYLENOL) tablet 1,000 mg (1,000 mg Oral Given 08/05/22 0018)  ketorolac (TORADOL) 30 MG/ML injection 30 mg (30 mg Intramuscular Given 08/05/22 0018)     IMPRESSION / MDM / ASSESSMENT AND PLAN / ED COURSE  I reviewed the triage vital signs and the nursing notes.  Differential diagnosis includes, but is not limited to, stroke, Bell's palsy, complex migraine, anxiety, sinusitis  {Patient presents with symptoms of an acute illness or injury that is  potentially life-threatening.  Generally healthy 26 year old female presents to the ED with paresthesias to the left side of her face, resolving with nonnarcotic analgesia possibly related to headache or complex migraine, and ultimately suitable for outpatient management.  She looks systemically well and has an essentially normal examination.  She is ambulatory with normal gait, no signs of neurologic or vascular deficits.  Sensation intact to the left side of her face, but she reports paresthesias.  CBC, metabolic panel, magnesium and troponin are normal.  CT scan of her head without evidence of intracranial pathology.  Resolution of symptoms after Toradol and Tylenol.  Her prolonged QTc is noted on the EKG but she has no syncopal or presyncopal symptoms to suggest cardiogenic pathology or dysrhythmia.  I think an outpatient recheck is reasonable.  Although I considered observation admission, I see no barriers to outpatient management.  We will discharge with return precautions.  Clinical Course as of 08/05/22 0113  Wynelle Link Aug 05, 2022  0102 Reassessed.  Patient reports feeling better after these analgesic medications.  Paresthesias resolved.  We discussed nonnarcotic multimodal analgesia at home.  We discussed OTC decongestants.  We discussed return precautions. [DS]    Clinical Course User Index [DS] Delton Prairie, MD     FINAL CLINICAL IMPRESSION(S) / ED DIAGNOSES   Final diagnoses:  Paresthesia  Sinus pressure     Rx / DC Orders   ED Discharge Orders     None        Note:  This document was prepared using Dragon voice recognition software and may include unintentional dictation errors.   Delton Prairie, MD 08/05/22 [phone]

## 2022-08-05 LAB — MAGNESIUM: Magnesium: 2.3 mg/dL (ref 1.7–2.4)

## 2022-08-05 MED ORDER — KETOROLAC TROMETHAMINE 30 MG/ML IJ SOLN
30.0000 mg | Freq: Once | INTRAMUSCULAR | Status: AC
  Administered 2022-08-05: 30 mg via INTRAMUSCULAR
  Filled 2022-08-05: qty 1

## 2022-08-05 MED ORDER — ACETAMINOPHEN 500 MG PO TABS
1000.0000 mg | ORAL_TABLET | Freq: Once | ORAL | Status: AC
  Administered 2022-08-05: 1000 mg via ORAL
  Filled 2022-08-05: qty 2

## 2022-08-05 NOTE — Discharge Instructions (Signed)
Please take Tylenol and ibuprofen/Advil for your pain.  It is safe to take them together, or to alternate them every few hours.  Take up to 1000mg of Tylenol at a time, up to 4 times per day.  Do not take more than 4000 mg of Tylenol in 24 hours.  For ibuprofen, take 400-600 mg, 3 - 4 times per day.  

## 2023-01-28 ENCOUNTER — Emergency Department: Payer: Self-pay

## 2023-01-28 ENCOUNTER — Encounter: Payer: Self-pay | Admitting: *Deleted

## 2023-01-28 ENCOUNTER — Other Ambulatory Visit: Payer: Self-pay

## 2023-01-28 DIAGNOSIS — E876 Hypokalemia: Secondary | ICD-10-CM | POA: Insufficient documentation

## 2023-01-28 DIAGNOSIS — R200 Anesthesia of skin: Secondary | ICD-10-CM | POA: Insufficient documentation

## 2023-01-28 DIAGNOSIS — R079 Chest pain, unspecified: Secondary | ICD-10-CM | POA: Insufficient documentation

## 2023-01-28 DIAGNOSIS — R112 Nausea with vomiting, unspecified: Secondary | ICD-10-CM | POA: Insufficient documentation

## 2023-01-28 DIAGNOSIS — R000 Tachycardia, unspecified: Secondary | ICD-10-CM | POA: Insufficient documentation

## 2023-01-28 LAB — CBC
HCT: 38.3 % (ref 36.0–46.0)
Hemoglobin: 12.4 g/dL (ref 12.0–15.0)
MCH: 29.8 pg (ref 26.0–34.0)
MCHC: 32.4 g/dL (ref 30.0–36.0)
MCV: 92.1 fL (ref 80.0–100.0)
Platelets: 372 10*3/uL (ref 150–400)
RBC: 4.16 MIL/uL (ref 3.87–5.11)
RDW: 13.2 % (ref 11.5–15.5)
WBC: 8.4 10*3/uL (ref 4.0–10.5)
nRBC: 0 % (ref 0.0–0.2)

## 2023-01-28 LAB — BASIC METABOLIC PANEL
Anion gap: 10 (ref 5–15)
BUN: 16 mg/dL (ref 6–20)
CO2: 23 mmol/L (ref 22–32)
Calcium: 9.6 mg/dL (ref 8.9–10.3)
Chloride: 103 mmol/L (ref 98–111)
Creatinine, Ser: 0.85 mg/dL (ref 0.44–1.00)
GFR, Estimated: >60 mL/min (ref >60)
Glucose, Bld: 110 mg/dL — ABNORMAL HIGH (ref 70–99)
Potassium: 3.1 mmol/L — ABNORMAL LOW (ref 3.5–5.1)
Sodium: 136 mmol/L (ref 135–145)

## 2023-01-28 LAB — TROPONIN I (HIGH SENSITIVITY): Troponin I (High Sensitivity): <2 ng/L (ref <18)

## 2023-01-28 NOTE — ED Triage Notes (Signed)
Pt has chest pain radiating in to left shoulder.  Pt taking prilosec for gerd without relief.  Pt has nausea. No sob  pt alert  speech clear.

## 2023-01-29 ENCOUNTER — Emergency Department
Admission: EM | Admit: 2023-01-29 | Discharge: 2023-01-29 | Disposition: A | Discharge status: Home / Self Care | Payer: Self-pay | Attending: Emergency Medicine | Admitting: Emergency Medicine

## 2023-01-29 ENCOUNTER — Emergency Department: Payer: Self-pay

## 2023-01-29 DIAGNOSIS — R079 Chest pain, unspecified: Secondary | ICD-10-CM

## 2023-01-29 LAB — PREGNANCY, URINE: Preg Test, Ur: NEGATIVE

## 2023-01-29 LAB — D-DIMER, QUANTITATIVE: D-Dimer, Quant: 0.86 ug/mL-FEU — ABNORMAL HIGH (ref 0.00–0.50)

## 2023-01-29 LAB — TROPONIN I (HIGH SENSITIVITY): Troponin I (High Sensitivity): <2 ng/L (ref <18)

## 2023-01-29 MED ORDER — ALUM & MAG HYDROXIDE-SIMETH 200-200-20 MG/5ML PO SUSP
30.0000 mL | Freq: Once | ORAL | Status: AC
  Administered 2023-01-29: 30 mL via ORAL
  Filled 2023-01-29: qty 30

## 2023-01-29 MED ORDER — LACTATED RINGERS IV BOLUS
1000.0000 mL | Freq: Once | INTRAVENOUS | Status: AC
  Administered 2023-01-29: 1000 mL via INTRAVENOUS

## 2023-01-29 MED ORDER — POTASSIUM CHLORIDE CRYS ER 20 MEQ PO TBCR
40.0000 meq | EXTENDED_RELEASE_TABLET | Freq: Once | ORAL | Status: AC
  Administered 2023-01-29: 40 meq via ORAL
  Filled 2023-01-29: qty 2

## 2023-01-29 MED ORDER — IOHEXOL 350 MG/ML SOLN
75.0000 mL | Freq: Once | INTRAVENOUS | Status: AC | PRN
  Administered 2023-01-29: 75 mL via INTRAVENOUS

## 2023-01-29 MED ORDER — ONDANSETRON HCL 4 MG/2ML IJ SOLN
4.0000 mg | Freq: Once | INTRAMUSCULAR | Status: AC
  Administered 2023-01-29: 4 mg via INTRAVENOUS
  Filled 2023-01-29: qty 2

## 2023-01-29 NOTE — Discharge Instructions (Addendum)
Your workup in the emergency department was reassuring, including screen for heart attack and blood clot.  Continue to take your prilosec as directed.  If you continue to have symptoms please follow-up with your primary care doctor.

## 2023-01-29 NOTE — ED Provider Notes (Signed)
[facility] Provider Note    Event Date/Time   First MD Initiated Contact with Patient 01/29/23 0016     (approximate)   History   Chest Pain   HPI  Shirley Wood is a 27 y.o. female past medical history of GERD who presents with chest pain.  Patient notes that she has had intermittent chest pain for about 4 weeks.  Tends to happen at the end of the day and after she is eating.  Feels like a tightness along the sternum occasionally radiating to the shoulder blade.  Today this episode started around noon and has been fairly constant.  It is nonexertional nonpleuritic.  Does not have shortness of breath.  Does feel nauseous and feels like she needs to vomit this is new today.  She also feels some numbness in the left jaw which is why she specifically presents to the ED today.  She denies history of DVT/PE lower extremity swelling birth control use or hemoptysis.     History reviewed. No pertinent past medical history.  There are no problems to display for this patient.    Physical Exam  Triage Vital Signs: ED Triage Vitals  Enc Vitals Group     BP 01/29/23 0010 (!) 149/102     Pulse Rate 01/29/23 0010 (!) 116     Resp 01/29/23 0010 18     Temp 01/29/23 0010 98.6 F (37 C)     Temp Source 01/29/23 0010 Oral     SpO2 01/29/23 0010 100 %     Weight 01/28/23 2255 175 lb (79.4 kg)     Height 01/28/23 2255 5' 6"$  (1.676 m)     Head Circumference --      Peak Flow --      Pain Score 01/28/23 2255 4     Pain Loc --      Pain Edu? --      Excl. in [place]? --     Most recent vital signs: Vitals:   01/29/23 0145 01/29/23 0243  BP:  114/75  Pulse: (!) 104 89  Resp:  13  Temp:    SpO2: 100% 98%     General: Awake, no distress.  Patient is mildly anxious appearing CV:  Good peripheral perfusion.  No leg asymmetry or edema, tachycardic Resp:  Normal effort.  Lung sounds are clear Abd:  No distention.  Neuro:             Awake, Alert, Oriented x  3  Other:     ED Results / Procedures / Treatments  Labs (all labs ordered are listed, but only abnormal results are displayed) Labs Reviewed  BASIC METABOLIC PANEL - Abnormal; Notable for the following components:      Result Value   Potassium 3.1 (*)    Glucose, Bld 110 (*)    All other components within normal limits  D-DIMER, QUANTITATIVE - Abnormal; Notable for the following components:   D-Dimer, Quant 0.86 (*)    All other components within normal limits  CBC  PREGNANCY, URINE  POC URINE PREG, ED  TROPONIN I (HIGH SENSITIVITY)  TROPONIN I (HIGH SENSITIVITY)     EKG  EKG reviewed interpreted myself shows sinus tachycardia normal axis normal intervals, T wave inversions inferiorly in lead V3   RADIOLOGY I reviewed and interpreted the CXR which does not show any acute cardiopulmonary process    PROCEDURES:  Critical Care performed: No  .1-3 Lead EKG Interpretation  Performed by:  Rada Hay, MD Authorized by: Rada Hay, MD     Interpretation: abnormal     ECG rate assessment: tachycardic     Rhythm: sinus tachycardia     Ectopy: none     Conduction: normal     The patient is on the cardiac monitor to evaluate for evidence of arrhythmia and/or significant heart rate changes.   MEDICATIONS ORDERED IN ED: Medications  lactated ringers bolus 1,000 mL (0 mLs Intravenous Stopped 01/29/23 0244)  alum & mag hydroxide-simeth (MAALOX/MYLANTA) 200-200-20 MG/5ML suspension 30 mL (30 mLs Oral Given 01/29/23 0118)  ondansetron (ZOFRAN) injection 4 mg (4 mg Intravenous Given 01/29/23 0119)  iohexol (OMNIPAQUE) 350 MG/ML injection 75 mL (75 mLs Intravenous Contrast Given 01/29/23 0156)  potassium chloride SA (KLOR-CON M) CR tablet 40 mEq (40 mEq Oral Given 01/29/23 0300)     IMPRESSION / MDM / ASSESSMENT AND PLAN / ED COURSE  I reviewed the triage vital signs and the nursing notes.                              Patient's presentation is most consistent  with acute presentation with potential threat to life or bodily function.  Differential diagnosis includes, but is not limited to, pulmonary embolism, myocarditis/pericarditis, GERD, reflux related pain, pregnancy, musculoskeletal pain  Patient is a 27 year old female presents with 4 weeks of intermittent chest pain as well as nausea and vomiting and jaw numbness this evening.  For the last 4 weeks she has had pain particularly after eating that feels like tightness occasionally radiating to the shoulder blades.  Pain seemed to worsen today and was accompanied by nausea vomiting and some numbness in the left jaw.  When she called her primary doctor she was told to go to the ER.  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  On arrival she is tachycardic heart rate in the 120s to 130s when I am in the room satting well.  Does look somewhat anxious.  Lung sounds are clear abdomen is benign she has no signs of DVT.  Her EKG does have some T wave abnormalities in the inferior leads as well as V3, I compared to EKG from 2018 this does look somewhat similar.  First troponin is negative.  With 4 weeks of symptoms that are primarily after eating I am suspicious that this is GI related but with her tachycardia and abnormal EKG will need to rule out pulmonary embolism.  Will obtain a D-dimer.  Will give GI cocktail fluid bolus.  Will obtain pregnancy test.  D-dimer is elevated at 0.860 CT angio obtained this is negative for pulmonary embolism.  Potassium is mildly low at 3.1 which was supplemented orally.  Patient felt improved after bolus and GI cocktail.  Of note there is a small hiatal hernia on her scan which could be causing reflux related pain.  Overall given the chronicity of symptoms and the fact that she has negative troponin and no pulmonary embolism I am reassured and I have low suspicion for life-threatening cause  of her pain.  Will discharge.  Discussed primary care follow-up.       FINAL CLINICAL IMPRESSION(S) / ED DIAGNOSES   Final diagnoses:  Chest pain, unspecified type     Rx / DC Orders   ED Discharge Orders     None  Note:  This document was prepared using Dragon voice recognition software and may include unintentional dictation errors.   Rada Hay, MD 01/29/23 [phone]

## 2023-01-29 NOTE — ED Notes (Signed)
Pt up to BR

## 2024-07-07 ENCOUNTER — Other Ambulatory Visit: Payer: Self-pay

## 2024-07-07 ENCOUNTER — Emergency Department
Admission: EM | Admit: 2024-07-07 | Discharge: 2024-07-08 | Disposition: A | Discharge status: Home / Self Care | Payer: Self-pay | Attending: Emergency Medicine | Admitting: Emergency Medicine

## 2024-07-07 ENCOUNTER — Emergency Department: Payer: Self-pay

## 2024-07-07 DIAGNOSIS — M79605 Pain in left leg: Secondary | ICD-10-CM | POA: Insufficient documentation

## 2024-07-07 NOTE — ED Provider Notes (Signed)
 [facility] Provider Note    Event Date/Time   First MD Initiated Contact with Patient 07/07/24 2051     (approximate)  History   Chief Complaint: Leg Pain  HPI  Shirley Wood is a 28 y.o. female with no signet past medical history presents to the emergency department for left leg discomfort.  According to the patient for the past 3 days or so she has been experiencing a mild cramping sensation in both of her legs.  Patient thought she could just be dehydrated due to the heat and has been trying to orally hydrate.  However she states over the last 24 hours or so she has had increased pain in the left leg now with a tender area to the left calf she was concerned so she came to the emergency department for evaluation.  Patient denies any chest pain at any point.  No history of blood clots previously.  Patient denies any hormone or estrogen use or birth control.  Physical Exam   Triage Vital Signs: ED Triage Vitals  Encounter Vitals Group     BP 07/07/24 2039 (!) 147/78     Girls Systolic BP Percentile --      Girls Diastolic BP Percentile --      Boys Systolic BP Percentile --      Boys Diastolic BP Percentile --      Pulse Rate 07/07/24 2039 (!) 112     Resp 07/07/24 2039 18     Temp 07/07/24 2039 98.3 F (36.8 C)     Temp src --      SpO2 07/07/24 2039 100 %     Weight 07/07/24 2038 186 lb (84.4 kg)     Height 07/07/24 2038 5' 6 (1.676 m)     Head Circumference --      Peak Flow --      Pain Score 07/07/24 2038 4     Pain Loc --      Pain Education --      Exclude from Growth Chart --     Most recent vital signs: Vitals:   07/07/24 2039  BP: (!) 147/78  Pulse: (!) 112  Resp: 18  Temp: 98.3 F (36.8 C)  SpO2: 100%    General: Awake, no distress.  CV:  Good peripheral perfusion.  Regular rate and rhythm  Resp:  Normal effort.  Equal breath sounds bilaterally.  Abd:  No distention.   Other:  Patient has no appreciable swelling in  either the lower extremity.  Patient does have an area of tenderness to the posterior calf of the left lower extremity.  Neurovascularly intact distally with 2+ DP pulse   ED Results / Procedures / Treatments   RADIOLOGY  Ultrasound pending   MEDICATIONS ORDERED IN ED: Medications - No data to display   IMPRESSION / MDM / ASSESSMENT AND PLAN / ED COURSE  I reviewed the triage vital signs and the nursing notes.  Patient's presentation is most consistent with acute presentation with potential threat to life or bodily function.  Patient presents emergency department for left leg discomfort.  Overall the patient appears well, no distress, reassuring physical exam.  Patient does appear mildly anxious but otherwise reassuring.  No appreciable findings on physical exam besides tenderness to the left calf.  No appreciable swelling no erythema.  Neurovascularly intact distally.  We will obtain an ultrasound as a precaution to rule out DVT.  Patient agreeable to plan  Patient's ultrasound  is pending.  Physical exam reassuring.  Lungs the patient's ultrasound is negative for DVT believe the patient can be safely discharged home to continue to use Tylenol /ibuprofen  as well as oral hydration as needed for muscle discomfort.  Patient care signed out to oncoming provider.  FINAL CLINICAL IMPRESSION(S) / ED DIAGNOSES   Left leg pain   Note:  This document was prepared using Dragon voice recognition software and may include unintentional dictation errors.   Dorothyann Drivers, MD 07/07/24 2250

## 2024-07-07 NOTE — Discharge Instructions (Addendum)
 Please continue to stay hydrated, drink plenty of fluids.  Please use Tylenol  or ibuprofen  every 6 hours as needed for muscular discomfort as written on the box.  Please follow-up with your doctor as needed.  Return to the emergency department for any symptom personally concerning to yourself.

## 2024-07-07 NOTE — ED Triage Notes (Addendum)
 Pt reports lower left leg pain swelling that began a few days ago, pt denies blood thinner use. Pt denies sob or chest pain. Pt denies injury/trauma to leg

## 2024-07-07 NOTE — ED Provider Notes (Signed)
-----------------------------------------   11:38 PM on 07/07/2024 -----------------------------------------  Assuming care from Dr. Dorothyann.  In short, Shirley Wood is a 28 y.o. female with a chief complaint of leg pain.  Refer to the original H&P for additional details.  The current plan of care is to follow up on imaging and dispo appropriately.   Clinical Course as of 07/08/24 1003  Tue Jul 07, 2024  2359 US  Venous Img Lower Unilateral Left I independently viewed and interpreted the patient's ultrasound and I see no evidence of DVT or other acute abnormality.  I also read the radiologist's report, which confirmed no acute findings.  Will discharge with musculoskeletal precautions as per Dr. Branda recommendations. [CF]    Clinical Course User Index [CF] Gordan Huxley, MD     Medications - No data to display   ED Discharge Orders     None      Final diagnoses:  Left leg pain     Gordan Huxley, MD 07/08/24 1003

## 2024-07-07 NOTE — ED Notes (Signed)
 [place] at bedside at this time

## 2024-07-08 NOTE — ED Notes (Signed)
Patient given discharge instructions including importance of follow up appt as needed with stated understanding. Patient stable and ambulatory with steady even gait on dispo.
